# Patient Record
Sex: Female | Born: 1965 | Race: Black or African American | Hispanic: No | Marital: Married | Smoking: Never smoker
Health system: Southern US, Community
[De-identification: ages and names within clinical notes are randomized; demographics above are authoritative.]

## PROBLEM LIST (undated history)

## (undated) HISTORY — PX: OTHER SURGICAL HISTORY: SHX169

## (undated) HISTORY — PX: ABDOMINAL HYSTERECTOMY: SHX81

## (undated) HISTORY — PX: PLACEMENT OF BREAST IMPLANTS: SHX6334

## (undated) HISTORY — PX: OVARIAN CYST REMOVAL: SHX89

---

## 2013-07-31 ENCOUNTER — Emergency Department (HOSPITAL_BASED_OUTPATIENT_CLINIC_OR_DEPARTMENT_OTHER): Payer: 59

## 2013-07-31 ENCOUNTER — Emergency Department (HOSPITAL_BASED_OUTPATIENT_CLINIC_OR_DEPARTMENT_OTHER)
Admission: EM | Admit: 2013-07-31 | Discharge: 2013-07-31 | Disposition: A | Discharge status: Home / Self Care | Payer: 59 | Attending: Emergency Medicine | Admitting: Emergency Medicine

## 2013-07-31 ENCOUNTER — Encounter (HOSPITAL_BASED_OUTPATIENT_CLINIC_OR_DEPARTMENT_OTHER): Payer: Self-pay | Admitting: Emergency Medicine

## 2013-07-31 DIAGNOSIS — Y836 Removal of other organ (partial) (total) as the cause of abnormal reaction of the patient, or of later complication, without mention of misadventure at the time of the procedure: Secondary | ICD-10-CM | POA: Insufficient documentation

## 2013-07-31 DIAGNOSIS — IMO0001 Reserved for inherently not codable concepts without codable children: Secondary | ICD-10-CM

## 2013-07-31 DIAGNOSIS — T814XXA Infection following a procedure, initial encounter: Secondary | ICD-10-CM

## 2013-07-31 DIAGNOSIS — Z880 Allergy status to penicillin: Secondary | ICD-10-CM | POA: Insufficient documentation

## 2013-07-31 DIAGNOSIS — T8140XA Infection following a procedure, unspecified, initial encounter: Secondary | ICD-10-CM | POA: Insufficient documentation

## 2013-07-31 DIAGNOSIS — R109 Unspecified abdominal pain: Secondary | ICD-10-CM | POA: Insufficient documentation

## 2013-07-31 DIAGNOSIS — R110 Nausea: Secondary | ICD-10-CM | POA: Insufficient documentation

## 2013-07-31 LAB — CBC WITH DIFFERENTIAL/PLATELET
Basophils Absolute: 0 10*3/uL (ref 0.0–0.1)
Basophils Relative: 0 % (ref 0–1)
EOS ABS: 0.1 10*3/uL (ref 0.0–0.7)
Eosinophils Relative: 1 % (ref 0–5)
HCT: 33.3 % — ABNORMAL LOW (ref 36.0–46.0)
Hemoglobin: 11.4 g/dL — ABNORMAL LOW (ref 12.0–15.0)
LYMPHS PCT: 24 % (ref 12–46)
Lymphs Abs: 2.3 10*3/uL (ref 0.7–4.0)
MCH: 27 pg (ref 26.0–34.0)
MCHC: 34.2 g/dL (ref 30.0–36.0)
MCV: 78.9 fL (ref 78.0–100.0)
MONOS PCT: 8 % (ref 3–12)
Monocytes Absolute: 0.8 10*3/uL (ref 0.1–1.0)
NEUTROS PCT: 67 % (ref 43–77)
Neutro Abs: 6.5 10*3/uL (ref 1.7–7.7)
PLATELETS: 210 10*3/uL (ref 150–400)
RBC: 4.22 MIL/uL (ref 3.87–5.11)
RDW: 21.9 % — AB (ref 11.5–15.5)
WBC: 9.7 10*3/uL (ref 4.0–10.5)

## 2013-07-31 LAB — URINE MICROSCOPIC-ADD ON

## 2013-07-31 LAB — URINALYSIS, ROUTINE W REFLEX MICROSCOPIC
Bilirubin Urine: NEGATIVE
Glucose, UA: NEGATIVE mg/dL
Hgb urine dipstick: NEGATIVE
Ketones, ur: NEGATIVE mg/dL
NITRITE: NEGATIVE
PH: 6 (ref 5.0–8.0)
Protein, ur: NEGATIVE mg/dL
SPECIFIC GRAVITY, URINE: 1.01 (ref 1.005–1.030)
Urobilinogen, UA: 0.2 mg/dL (ref 0.0–1.0)

## 2013-07-31 MED ORDER — CLINDAMYCIN HCL 300 MG PO CAPS: 300.0000 mg | ORAL_CAPSULE | Freq: Four times a day (QID) | ORAL | Status: AC

## 2013-07-31 MED ORDER — CLINDAMYCIN HCL 150 MG PO CAPS
300.0000 mg | ORAL_CAPSULE | Freq: Once | ORAL | Status: AC
  Administered 2013-07-31: 300 mg via ORAL
  Filled 2013-07-31: qty 2

## 2013-07-31 MED ORDER — IOHEXOL 300 MG/ML  SOLN
100.0000 mL | Freq: Once | INTRAMUSCULAR | Status: AC | PRN
  Administered 2013-07-31: 100 mL via INTRAVENOUS

## 2013-07-31 NOTE — ED Provider Notes (Signed)
CSN: [id number]     Arrival date & time 07/31/13  1919 History  This chart was scribed for Rolan BuccoMelanie Chiann Goffredo, MD by Carl Bestelina Holson, ED Scribe. This patient was seen in room MH05/MH05 and the patient's care was started at 8:50 PM.      Chief Complaint  Patient presents with  . Wound Infection    The history is provided by the patient. No language interpreter was used.   HPI Comments: Alexis Bailey is a 48 y.o. female who presents to the Emergency Department complaining of a painful, erythematous infection with associated swelling located at the patient's hysterectomy site that started three days ago.  The patient states that movement aggravates her pain.  She denies fever, difficulty urinating, vaginal pain, and discharge as associated symptoms.  The patient [place] ago [place]  She states that her six week appointment is on Wednesday of next week.  She denies having a history of DM, skin infections, or kidney problems.    History reviewed. No pertinent past medical history. Past Surgical History  Procedure Laterality Date  . Abdominal hysterectomy    . Tummy tuck    . Placement of breast implants    . Ovarian cyst removal     No family history on file. History  Substance Use Topics  . Smoking status: Never Smoker   . Smokeless tobacco: Not on file  . Alcohol Use: No   OB History   Grav Para Term Preterm Abortions TAB SAB Ect Mult Living                 Review of Systems  Constitutional: Negative for fever, chills, diaphoresis and fatigue.  HENT: Negative for congestion, rhinorrhea and sneezing.   Eyes: Negative.   Respiratory: Negative for cough, chest tightness and shortness of breath.   Cardiovascular: Negative for chest pain and leg swelling.  Gastrointestinal: Positive for nausea and abdominal pain. Negative for vomiting, diarrhea and blood in stool.  Genitourinary: Negative for frequency, hematuria, flank pain, decreased urine volume, difficulty  urinating and vaginal pain.  Musculoskeletal: Negative for arthralgias and back pain.  Skin: Positive for wound. Negative for rash.  Neurological: Negative for dizziness, speech difficulty, weakness, numbness and headaches.  All other systems reviewed and are negative.     Allergies  Penicillins  Home Medications   Prior to Admission medications   Medication Sig Start Date End Date Taking? Authorizing Provider  clindamycin (CLEOCIN) 300 MG capsule Take 1 capsule (300 mg total) by mouth 4 (four) times daily. X 7 days 07/31/13   Rolan BuccoMelanie Josedaniel Haye, MD   Triage Vitals: BP 119/80  Pulse 70  Temp(Src) 98.6 F (37 C) (Oral)  Resp 20  Ht 5\' 2"  (1.575 m)  Wt 134 lb (60.782 kg)  BMI 24.50 kg/m2  SpO2 100%  Physical Exam  Constitutional: She is oriented to person, place, and time. She appears well-developed and well-nourished.  HENT:  Head: Normocephalic and atraumatic.  Eyes: Pupils are equal, round, and reactive to light.  Neck: Normal range of motion. Neck supple.  Cardiovascular: Normal rate, regular rhythm and normal heart sounds.   Pulmonary/Chest: Effort normal and breath sounds normal. No respiratory distress. She has no wheezes. She has no rales. She exhibits no tenderness.  Abdominal: Soft. Bowel sounds are normal. There is tenderness. There is no rebound and no guarding.  Healing incision to lower abdomen.  No drainage.  Edges well approximated.  There is some firmness and tenderness to the  right aspect of the incision. There is no induration or fluctuance. There is some erythema to the right aspect of the incision and across the suprapubic area.  Musculoskeletal: Normal range of motion. She exhibits no edema.  Lymphadenopathy:    She has no cervical adenopathy.  Neurological: She is alert and oriented to person, place, and time.  Skin: Skin is warm and dry. No rash noted.  Psychiatric: She has a normal mood and affect.    ED Course  Procedures (including critical care  time)  DIAGNOSTIC STUDIES: Oxygen Saturation is 100% on room air, normal by my interpretation.    COORDINATION OF CARE: 8:54 PM- Discussed a clinical suspicion of an infection and starting the patient on a course of antibiotics.  The patient opted for a CT scan of her abdomen.  The patient agreed to the treatment plan.     Labs Review Labs Reviewed  URINALYSIS, ROUTINE W REFLEX MICROSCOPIC - Abnormal; Notable for the following:    Leukocytes, UA SMALL (*)    All other components within normal limits  URINE MICROSCOPIC-ADD ON - Abnormal; Notable for the following:    Bacteria, UA FEW (*)    All other components within normal limits  CBC WITH DIFFERENTIAL - Abnormal; Notable for the following:    Hemoglobin 11.4 (*)    HCT 33.3 (*)    RDW 21.9 (*)    All other components within normal limits    Imaging Review Ct Pelvis W Contrast  07/31/2013   CLINICAL DATA:  Erythema and swelling at the site scar from hysterectomy 5 weeks ago.  EXAM: CT PELVIS WITH CONTRAST  TECHNIQUE: Multidetector CT imaging of the pelvis was performed using the standard protocol following the bolus administration of intravenous contrast.  CONTRAST:  OMNIPAQUE IOHEXOL 300 MG/ML  SOLN  COMPARISON:  None.  FINDINGS: There is some soft tissue attenuation in the subcutaneous fat at the level of the patient's low transverse incision. The appearance by CT is not substantially outside of the expected appearance for a patient 5 weeks after hysterectomy. While by report there is evidence of cellulitis at the surgical site, CT shows no underlying abscess or fluid collection.  Right ovary measures 4.3 x 3.6 x 5.0 cm. 2.1 x 1.2 cm the fatty lesion in the right ovary suggests the presence of a dermoid.  Left ovary is normal by CT imaging. There is no pelvic sidewall lymphadenopathy. A trace amount of intraperitoneal free fluid is evident.  Bone windows are unremarkable.  IMPRESSION: There is some minimal soft tissue attenuation  in the fat of the lower anterior abdominal wall, at the level of the low transverse incision. No underlying abscess.  Small fatty lesion in the right ovary is consistent with the presence of a right ovarian dermoid.  Trace intraperitoneal free fluid may be physiologic in a premenopausal female.   Electronically Signed   By: Kennith Center M.D.   On: 07/31/2013 22:19     EKG Interpretation None      MDM   Final diagnoses:  Wound infection after surgery, initial encounter    There is no evidence of abscess. Patient is not febrile and has no other signs of systemic infection. I will go ahead and start on clindamycin. She is from [place] and is going back this weekend. I advised her to follow up with her surgeon as scheduled next week or on Monday if her symptoms are not improving. She was given a copy of her lab work  as well as her CT  results and copy of images to take back with her to [place]. I advised her that there were some leukocytes on her blood smear and that she can mention this to her primary care physician.  I personally performed the services described in this documentation, which was scribed in my presence.  The recorded information has been reviewed and considered.    Rolan Bucco, MD 07/31/13 2252

## 2013-07-31 NOTE — Discharge Instructions (Signed)

## 2013-07-31 NOTE — ED Notes (Signed)
Pt states had a hysterectomy 5wks ago, c/o redness and swelling to wound area x4days; no drainage

## 2016-01-23 IMAGING — CT CT PELVIS W/ CM
2 of 6 series · 14 of 46 positions shown, 18 images · IV contrast (omnipaque)
Comparison: None.

CLINICAL DATA: Erythema and swelling at the site scar from
hysterectomy 5 weeks ago.

EXAM:
CT PELVIS WITH CONTRAST
TECHNIQUE: Multidetector CT imaging of the pelvis was performed using the
standard protocol following the bolus administration of intravenous
contrast.
CONTRAST:  100mL OMNIPAQUE IOHEXOL 300 MG/ML  SOLN

[Series 2: pelvis 5.0 b31f · axial · 0.82mm/px · z∈[-469,-229]mm · 11 of 57 slices shown, 15 images]
[im 6/57  soft-tissue]
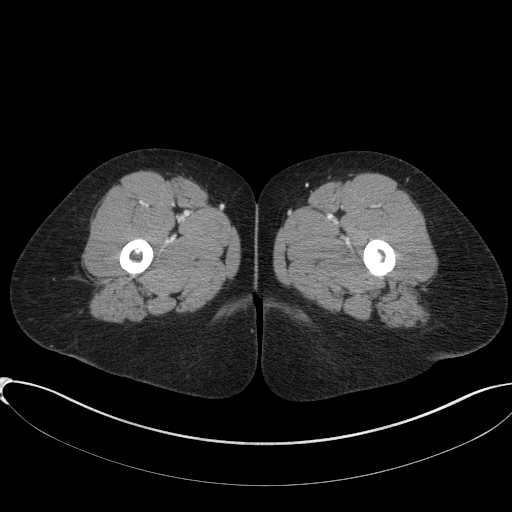
[im 6/57  bone]
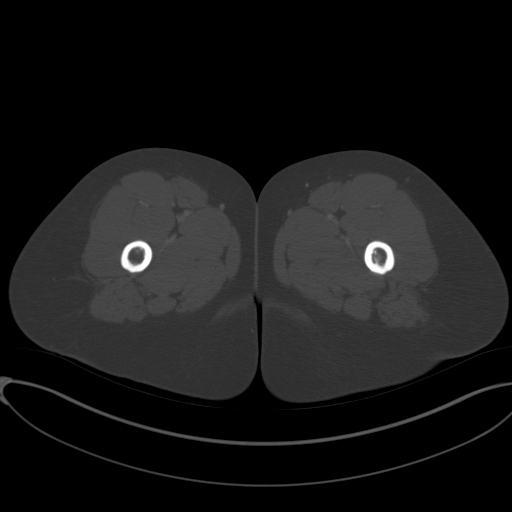
[im 12/57  soft-tissue]
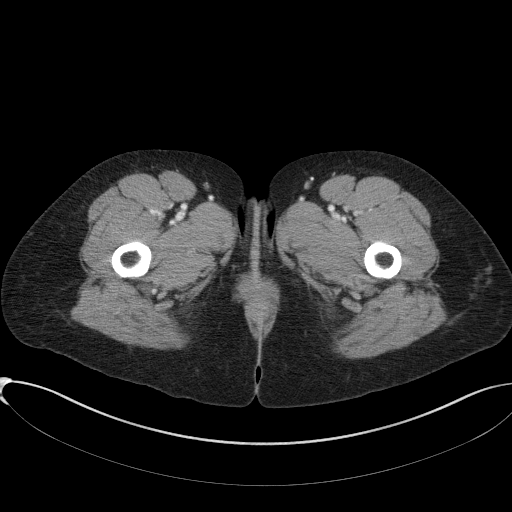
[im 17/57  soft-tissue]
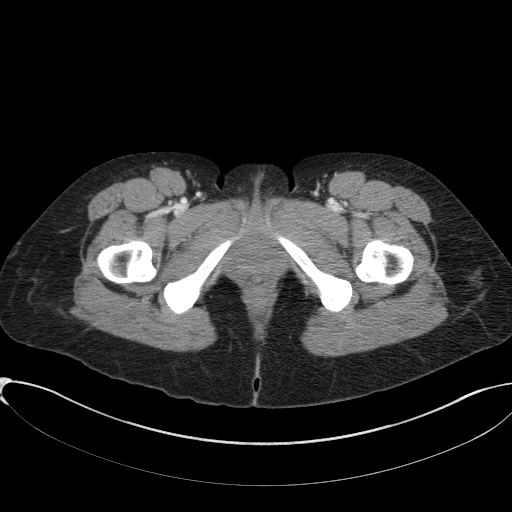
[im 23/57  soft-tissue]
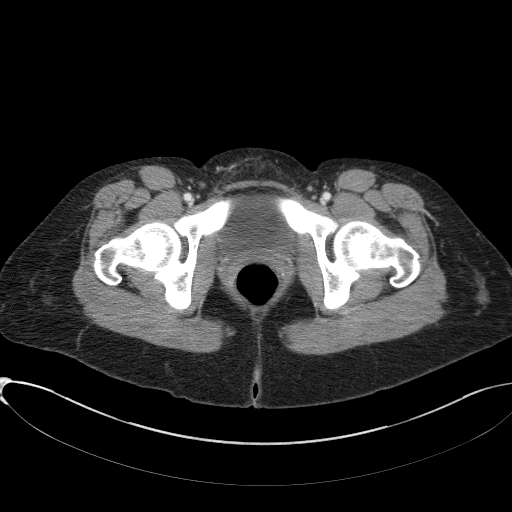
[im 29/57  soft-tissue]
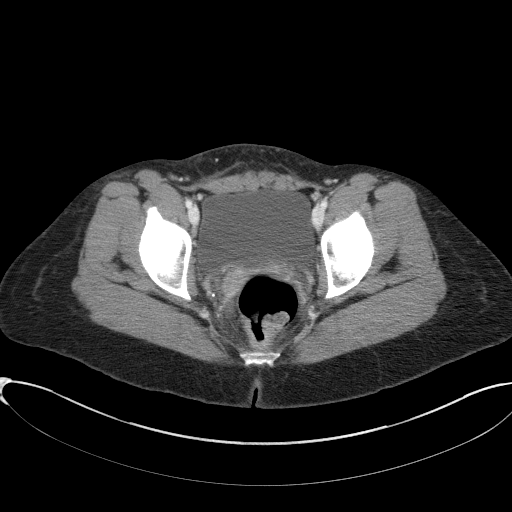
[im 34/57  soft-tissue]
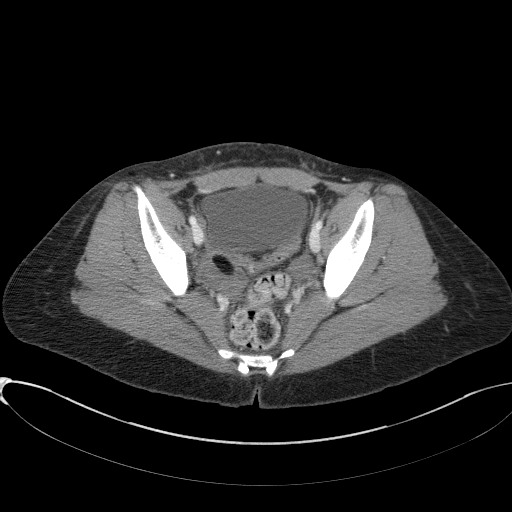
[im 40/57  soft-tissue]
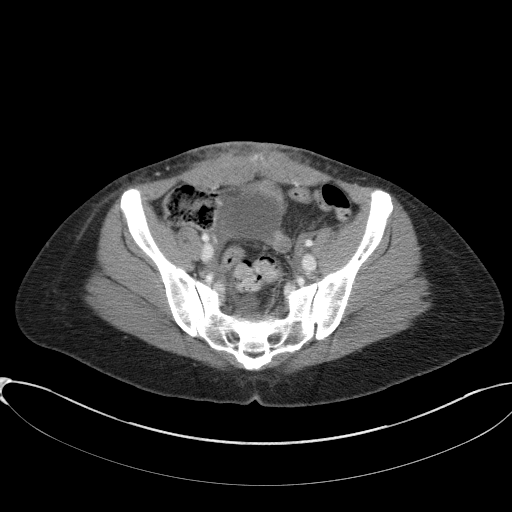
[im 45/57  soft-tissue]
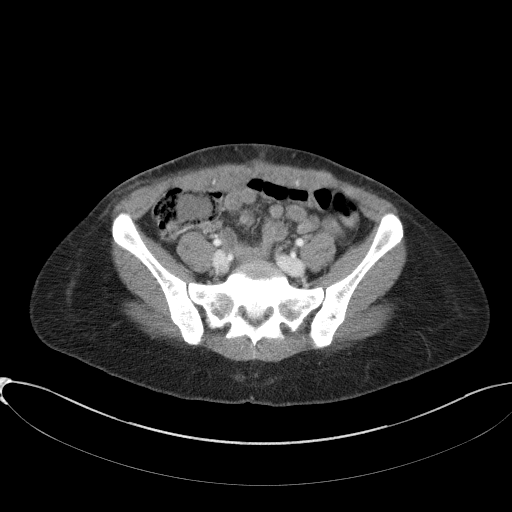
[im 45/57  lung]
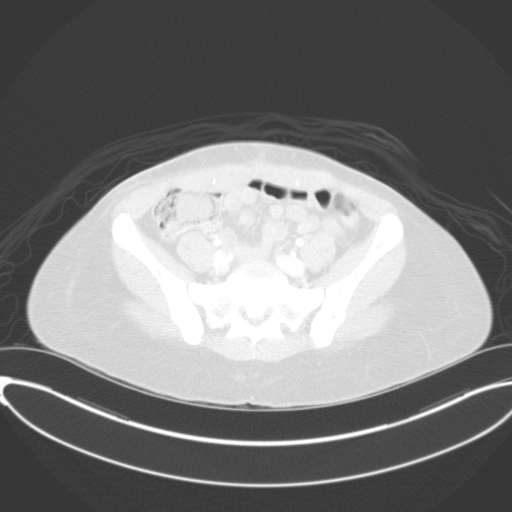
[im 48/57  lung]
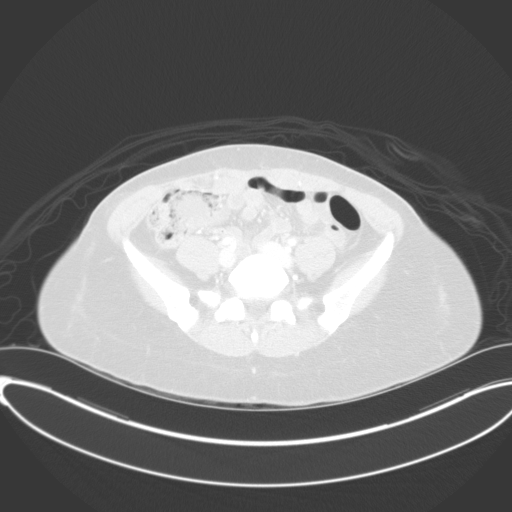
[im 51/57  soft-tissue]
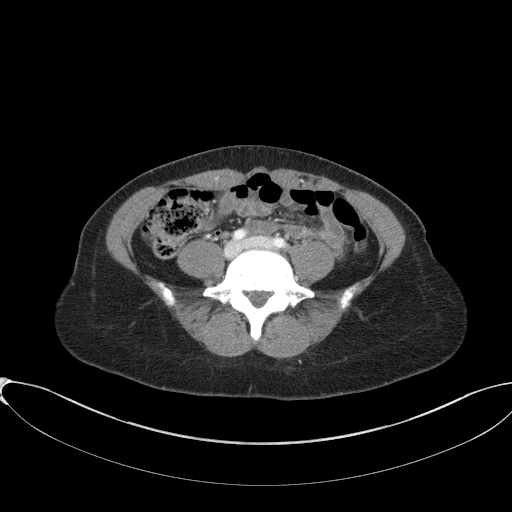
[im 51/57  lung]
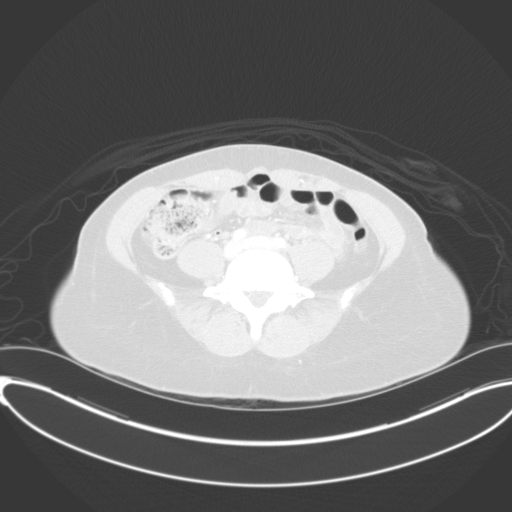
[im 51/57  bone]
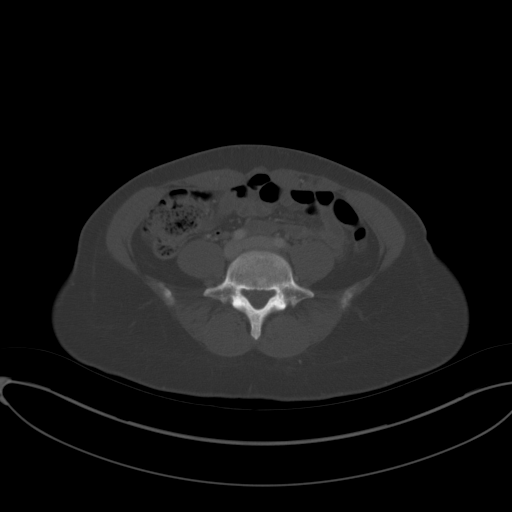
[im 54/57  lung]
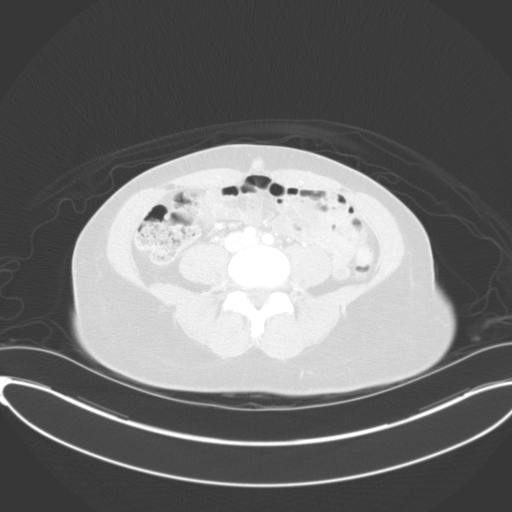

[Series 3: pelvis 2.0 coronal · coronal · 0.60mm/px · 3 of 107 slices shown]
[im 27/107  soft-tissue]
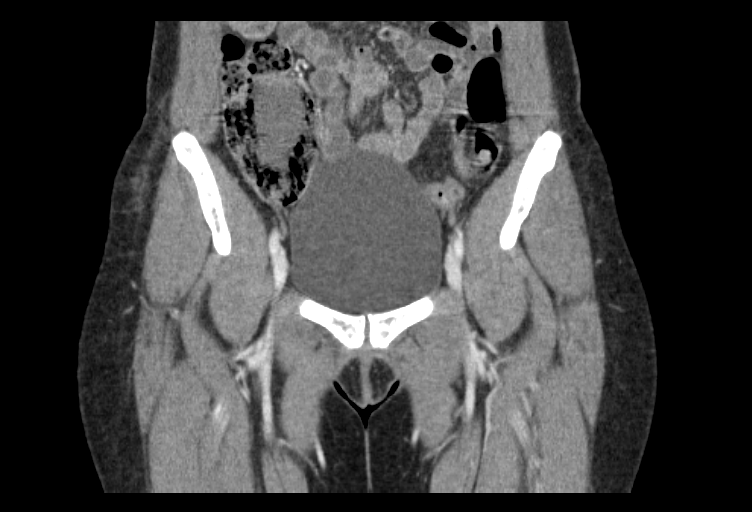
[im 54/107  soft-tissue]
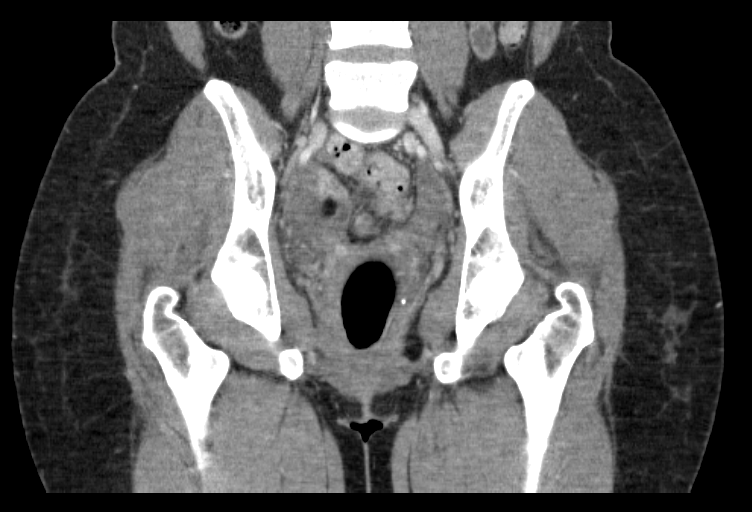
[im 80/107  soft-tissue]
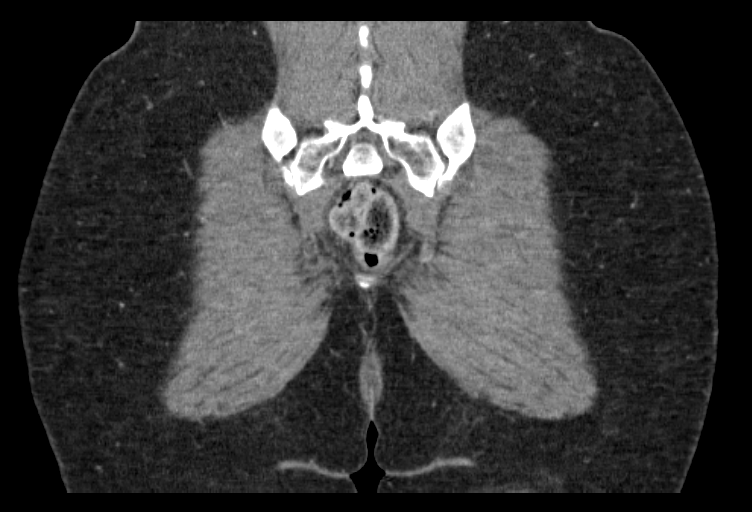

[14 of 46 positions shown; findings below may reference images not displayed]

FINDINGS: There is some soft tissue attenuation in the subcutaneous fat at the
level of the patient's low transverse incision. The appearance by CT
is not substantially outside of the expected appearance for a
patient 5 weeks after hysterectomy. While by report there is
evidence of cellulitis at the surgical site, CT shows no underlying
abscess or fluid collection.

Right ovary measures 4.3 x 3.6 x 5.0 cm. 2.1 x 1.2 cm the fatty
lesion in the right ovary suggests the presence of a dermoid.

Left ovary is normal by CT imaging. There is no pelvic sidewall
lymphadenopathy. A trace amount of intraperitoneal free fluid is
evident.

Bone windows are unremarkable.
IMPRESSION: There is some minimal soft tissue attenuation in the fat of the
lower anterior abdominal wall, at the level of the low transverse
incision. No underlying abscess.

Small fatty lesion in the right ovary is consistent with the
presence of a right ovarian dermoid.

Trace intraperitoneal free fluid may be physiologic in a
premenopausal female.
